# Patient Record
Sex: Male | Born: 1995 | Race: Black or African American | Hispanic: No | Marital: Single | Smoking: Never smoker
Health system: Southern US, Community
[De-identification: ages and names within clinical notes are randomized; demographics above are authoritative.]

## PROBLEM LIST (undated history)

## (undated) DIAGNOSIS — F988 Other specified behavioral and emotional disorders with onset usually occurring in childhood and adolescence: Secondary | ICD-10-CM

## (undated) DIAGNOSIS — L700 Acne vulgaris: Secondary | ICD-10-CM

## (undated) DIAGNOSIS — M4120 Other idiopathic scoliosis, site unspecified: Secondary | ICD-10-CM

## (undated) HISTORY — DX: Acne vulgaris: L70.0

## (undated) HISTORY — DX: Other idiopathic scoliosis, site unspecified: M41.20

## (undated) HISTORY — DX: Other specified behavioral and emotional disorders with onset usually occurring in childhood and adolescence: F98.8

---
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
## 2013-04-01 ENCOUNTER — Encounter (HOSPITAL_COMMUNITY): Payer: Self-pay | Admitting: Emergency Medicine

## 2013-04-01 ENCOUNTER — Emergency Department (HOSPITAL_COMMUNITY): Payer: Self-pay

## 2013-04-01 ENCOUNTER — Emergency Department (HOSPITAL_COMMUNITY)
Admission: EM | Admit: 2013-04-01 | Discharge: 2013-04-01 | Disposition: A | Discharge status: Home / Self Care | Payer: Self-pay | Attending: Emergency Medicine | Admitting: Emergency Medicine

## 2013-04-01 DIAGNOSIS — IMO0002 Reserved for concepts with insufficient information to code with codable children: Secondary | ICD-10-CM

## 2013-04-01 DIAGNOSIS — Z8739 Personal history of other diseases of the musculoskeletal system and connective tissue: Secondary | ICD-10-CM | POA: Insufficient documentation

## 2013-04-01 DIAGNOSIS — L03119 Cellulitis of unspecified part of limb: Principal | ICD-10-CM

## 2013-04-01 DIAGNOSIS — F988 Other specified behavioral and emotional disorders with onset usually occurring in childhood and adolescence: Secondary | ICD-10-CM | POA: Insufficient documentation

## 2013-04-01 DIAGNOSIS — Z79899 Other long term (current) drug therapy: Secondary | ICD-10-CM | POA: Insufficient documentation

## 2013-04-01 DIAGNOSIS — L02519 Cutaneous abscess of unspecified hand: Secondary | ICD-10-CM | POA: Insufficient documentation

## 2013-04-01 MED ORDER — CEPHALEXIN 500 MG PO CAPS
500.0000 mg | ORAL_CAPSULE | Freq: Four times a day (QID) | ORAL | Status: DC
Start: 2013-04-01 — End: 2013-09-18

## 2013-04-01 NOTE — Discharge Instructions (Signed)
Cellulitis Cellulitis is an infection of the skin and the tissue beneath it. The infected area is usually red and tender. Cellulitis occurs most often in the arms and lower legs.  CAUSES  Cellulitis is caused by bacteria that enter the skin through cracks or cuts in the skin. The most common types of bacteria that cause cellulitis are Staphylococcus and Streptococcus. SYMPTOMS   Redness and warmth.  Swelling.  Tenderness or pain.  Fever. DIAGNOSIS  Your caregiver can usually determine what is wrong based on a physical exam. Blood tests may also be done. TREATMENT  Treatment usually involves taking an antibiotic medicine. HOME CARE INSTRUCTIONS   Take your antibiotics as directed. Finish them even if you start to feel better.  Keep the infected arm or leg elevated to reduce swelling.  Apply a warm cloth to the affected area up to 4 times per day to relieve pain.  Only take over-the-counter or prescription medicines for pain, discomfort, or fever as directed by your caregiver.  Keep all follow-up appointments as directed by your caregiver. SEEK MEDICAL CARE IF:   You notice red streaks coming from the infected area.  Your red area gets larger or turns dark in color.  Your bone or joint underneath the infected area becomes painful after the skin has healed.  Your infection returns in the same area or another area.  You notice a swollen bump in the infected area.  You develop new symptoms. SEEK IMMEDIATE MEDICAL CARE IF:   You have a fever.  You feel very sleepy.  You develop vomiting or diarrhea.  You have a general ill feeling (malaise) with muscle aches and pains. MAKE SURE YOU:   Understand these instructions.  Will watch your condition.  Will get help right away if you are not doing well or get worse. Document Released: 12/24/2004 Document Revised: 09/15/2011 Document Reviewed: 06/01/2011 ExitCare Patient Information 2014 [employer].  

## 2013-04-01 NOTE — ED Notes (Signed)
Per pt sts woke up and left hand was swollen. Denies injury

## 2013-04-01 NOTE — ED Notes (Signed)
Patient transported to X-ray 

## 2013-04-01 NOTE — ED Notes (Signed)
Patient returned from X-ray 

## 2013-04-01 NOTE — ED Provider Notes (Signed)
CSN: [id number]     Arrival date & time 04/01/13  1440 History  This chart was scribed for non-physician practitioner, Felicie Morn, NP-C working with Celene Kras, MD by Greggory Stallion, ED scribe. This patient was seen in room TR09C/TR09C and the patient's care was started at 3:57 PM.   Chief Complaint  Patient presents with  . Hand Problem   Patient is a 18 y.o. male presenting with hand pain. The history is provided by the patient. No language interpreter was used.  Hand Pain This is a new problem. The current episode started 12 to 24 hours ago. The problem has been gradually worsening. Pertinent negatives include no headaches. The symptoms are aggravated by bending. Nothing relieves the symptoms. He has tried water for the symptoms. The treatment provided no relief.   HPI Comments: Eric Arnold is a 18 y.o. male who presents to the Emergency Department complaining of left hand swelling that started this morning when he woke up. He reports no pain when he woke this morning but now it hurts on palpation and when he extends his fingers. He denies injury. He denies fevers, chills. He is a Consulting civil engineer at [employer] and does some typing on his laptop but denies repetitive motions. He states he lifted his couch yesterday but denies feeling anything unusual. He has tried rinsing the hand under cold and warm water but has not tried icing the area. He has no allergies to medications.   Past Medical History  Diagnosis Date  . ADD (attention deficit disorder)   . Scoliosis (and kyphoscoliosis), idiopathic   . Acne vulgaris    History reviewed. No pertinent past surgical history. History reviewed. No pertinent family history. History  Substance Use Topics  . Smoking status: Never Smoker   . Smokeless tobacco: Not on file  . Alcohol Use: No    Review of Systems  Constitutional: Negative for fever and chills.  Musculoskeletal: Positive for arthralgias (left hand) and joint swelling.  Neurological:  Negative for headaches.  All other systems reviewed and are negative.    Allergies  Review of patient's allergies indicates no known allergies.  Home Medications   Current Outpatient Rx  Name  Route  Sig  Dispense  Refill  . adapalene (DIFFERIN) 0.1 % cream   Topical   Apply topically at bedtime.   45 g   0   . methylphenidate (CONCERTA) 54 MG CR tablet   Oral   Take 1 tablet (54 mg total) by mouth every morning.   30 tablet   0    BP 130/74  Pulse 82  Temp(Src) 98.6 F (37 C) (Oral)  Resp 16  Wt 212 lb 14.4 oz (96.571 kg)  SpO2 98%  Physical Exam  Nursing note and vitals reviewed. Constitutional: He is oriented to person, place, and time. He appears well-developed and well-nourished. No distress.  HENT:  Head: Normocephalic and atraumatic.  Eyes: EOM are normal.  Neck: Neck supple. No tracheal deviation present.  Cardiovascular: Normal rate.   Pulmonary/Chest: Effort normal. No respiratory distress.  Musculoskeletal: Normal range of motion.  swelling over the MCP joint of the third finger of the left hand, questionable insect bite.   Neurological: He is alert and oriented to person, place, and time.  Skin: Skin is warm and dry.  Psychiatric: He has a normal mood and affect. His behavior is normal.    ED Course  Procedures (including critical care time) Medications - No data to display  DIAGNOSTIC STUDIES: Oxygen  Saturation is 98% on RA, normal by my interpretation.    COORDINATION OF CARE: 4:02 PM-Discussed treatment plan with pt at bedside which includes Keflex and pt agreed to plan.   Labs Review Labs Reviewed - No data to display Imaging Review Dg Hand Complete Left  04/01/2013   CLINICAL DATA:  Posterior left hand pain and swelling for 1 day with no injury  EXAM: LEFT HAND - COMPLETE 3+ VIEW  COMPARISON:  None.  FINDINGS: No fracture, dislocation, foreign body, or periosteal reaction. There is dorsal soft tissue swelling over the distal metacarpals.   IMPRESSION: Nonspecific soft tissue swelling.  Otherwise negative.   Electronically Signed   By: Esperanza Heiraymond  Rubner M.D.   On: 04/01/2013 15:52    EKG Interpretation   None       MDM  Cellulitis left hand.  No indication of tenosynovitis.  Antibiotic, PCP follow-up.  I personally performed the services described in this documentation, which was scribed in my presence. The recorded information has been reviewed and is accurate.   Jimmye Normanavid John Mamoudou [place], NP 04/02/13 [phone]

## 2013-04-02 NOTE — ED Provider Notes (Signed)
Medical screening examination/treatment/procedure(s) were performed by non-physician practitioner and as supervising physician I was immediately available for consultation/collaboration.    Cabe Lashley R Kushal Saunders, MD 04/02/13 1547 

## 2013-04-19 ENCOUNTER — Ambulatory Visit: Payer: No Typology Code available for payment source

## 2013-08-22 ENCOUNTER — Ambulatory Visit: Payer: No Typology Code available for payment source | Admitting: Family Medicine

## 2013-09-18 ENCOUNTER — Encounter: Payer: Self-pay | Admitting: Family Medicine

## 2013-09-18 ENCOUNTER — Ambulatory Visit (INDEPENDENT_AMBULATORY_CARE_PROVIDER_SITE_OTHER): Payer: No Typology Code available for payment source | Admitting: Family Medicine

## 2013-09-18 VITALS — BP 118/59 | HR 64 | Temp 98.2°F | Ht 61.0 in | Wt 218.1 lb

## 2013-09-18 DIAGNOSIS — F909 Attention-deficit hyperactivity disorder, unspecified type: Secondary | ICD-10-CM

## 2013-09-18 MED ORDER — METHYLPHENIDATE HCL 10 MG PO TABS
10.0000 mg | ORAL_TABLET | Freq: Two times a day (BID) | ORAL | Status: AC
Start: 2013-09-18 — End: ?

## 2013-09-18 NOTE — Patient Instructions (Signed)
Please try to pick up the Ritalin.  If this is too expensive, please let me know so we can try to get this for you.  Thanks, Dr. Paulina FusiHess

## 2013-09-18 NOTE — Assessment & Plan Note (Signed)
Unable to afford Concerta.  Rx coupon printed off for ritalin 10 mg, 90 tab supply for possibly $15.  Recommend they pick this up, but he is not currently in school due to summer, and will have him start in late July (1 tab in AM, and one in early PM) to see how this does for him.  Will f/u in 6 weeks prior to school starting.

## 2013-09-18 NOTE — Progress Notes (Signed)
Eric Arnold is a 18 y.o. male who presents today for ADHD  ADHD: Patient carries a diagnosis of ADHD and has been on concerta 36 mg qd in the past.  However, pt lost medicaid and was unable to afford this for the last 6 months.  Per mom and himself, he did well in school but his concentration was still not very good.  They got a dog, which seemed to help him in social situations.  Would like to try something cheaper than Concerta.    Past Medical History  Diagnosis Date  . ADD (attention deficit disorder)   . Scoliosis (and kyphoscoliosis), idiopathic   . Acne vulgaris     History  Smoking status  . Never Smoker   Smokeless tobacco  . Not on file    No family history on file.  Current Outpatient Prescriptions on File Prior to Visit  Medication Sig Dispense Refill  . adapalene (DIFFERIN) 0.1 % cream Apply topically at bedtime.  45 g  0  . cephALEXin (KEFLEX) 500 MG capsule Take 1 capsule (500 mg total) by mouth 4 (four) times daily.  20 capsule  0  . methylphenidate (CONCERTA) 54 MG CR tablet Take 1 tablet (54 mg total) by mouth every morning.  30 tablet  0   No current facility-administered medications on file prior to visit.    ROS: Per HPI.  All other systems reviewed and are negative.   Physical Exam Filed Vitals:   09/18/13 1008  BP: 118/59  Pulse: 64  Temp: 98.2 F (36.8 C)    Gen: NAD. Normal Affect and mood  CV: RRR  Resp: CTABL

## 2013-09-21 ENCOUNTER — Telehealth: Payer: Self-pay | Admitting: Family Medicine

## 2013-09-21 NOTE — Telephone Encounter (Signed)
Mom called. She needed to tell dr about things Eric Arnold is forgetting. Wanted him to know this. hasnt gotten meds for ADHD.  Just wondered if these were symptons of ADH.d

## 2013-09-21 NOTE — Telephone Encounter (Signed)
Patient's mother was concerned about ADHD causing memory loss.I informed her that Adhd dx is mainly for focus issues.I advised her to schedule a follow with [facility] after having son take new RX ritalin for 3 weeks and at that visit to have Dr Paulina FusiHess go in detail about Adhd and memory loss.she voiced great appreciation.Proposito, Virgel BouquetGiovanna S

## 2013-10-17 ENCOUNTER — Ambulatory Visit: Payer: No Typology Code available for payment source

## 2014-05-01 ENCOUNTER — Ambulatory Visit: Payer: Self-pay

## 2014-05-07 ENCOUNTER — Ambulatory Visit: Payer: Self-pay

## 2016-12-09 ENCOUNTER — Encounter (HOSPITAL_COMMUNITY): Payer: Self-pay | Admitting: *Deleted

## 2016-12-09 ENCOUNTER — Ambulatory Visit (HOSPITAL_COMMUNITY)
Admission: EM | Admit: 2016-12-09 | Discharge: 2016-12-09 | Disposition: A | Discharge status: Home / Self Care | Payer: BLUE CROSS/BLUE SHIELD | Attending: Family Medicine | Admitting: Family Medicine

## 2016-12-09 DIAGNOSIS — K529 Noninfective gastroenteritis and colitis, unspecified: Secondary | ICD-10-CM

## 2016-12-09 MED ORDER — ONDANSETRON 4 MG PO TBDP
4.0000 mg | ORAL_TABLET | Freq: Three times a day (TID) | ORAL | 0 refills | Status: AC | PRN
Start: 2016-12-09 — End: ?

## 2016-12-09 NOTE — ED Triage Notes (Signed)
Pt  Reports  About  4  Days  Ago he   Developed  abd  Pain   With     Diarrhea    And  Discomfort  In rectal   Area     - pt  Reports  Pain   Is   Worse   When he  Coughs  Or  Laughs       He  Is  Sitting  Upright on the  Exam table  Speaking in  Complete    sentances

## 2016-12-10 NOTE — ED Provider Notes (Signed)
  [facility]   [id number] 12/09/16 Arrival Time: 1404  ASSESSMENT & PLAN:  1. Gastroenteritis     Meds ordered this encounter  Medications  . ondansetron (ZOFRAN-ODT) 4 MG disintegrating tablet    Sig: Take 1 tablet (4 mg total) by mouth every 8 (eight) hours as needed for nausea or vomiting.    Dispense:  12 tablet    Refill:  0   Ensure adequate fluid intake. Symptoms should resolve within the next day or two. May f/u as needed. Reviewed expectations re: course of current medical issues. Questions answered. Outlined signs and symptoms indicating need for more acute intervention. Patient verbalized understanding. After Visit Summary given.   SUBJECTIVE:  Eric Arnold is a 21 y.o. male who presents with complaint of diarrhea for 3-4 days. Feels like it's getting better but still with crampy feeling in abdomen. No back or flank pain. Mild nausea without emesis. Decreased appetite. Afebrile. No recent travel. No OTC treatment.  ROS: As per HPI.   OBJECTIVE:  Vitals:   12/09/16 1432  BP: 122/70  Pulse: 72  Resp: 18  Temp: 98.6 F (37 C)  TempSrc: Oral  SpO2: 100%    General appearance: alert; no distress HENT: moist oropharynx Heart: regular rate and rhythm Abdomen: soft, non-tender; bowel sounds normal; no masses or organomegaly; no guarding or rebound tenderness Back: no CVA tenderness Extremities: no cyanosis or edema; symmetrical with no gross deformities Skin: warm and dry Psychological: alert and cooperative; normal mood and affect  No Known Allergies  Past Medical History:  Diagnosis Date  . Acne vulgaris   . ADD (attention deficit disorder)   . Scoliosis (and kyphoscoliosis), idiopathic    Social History   Social History  . Marital status: Single    Spouse name: N/A  . Number of children: N/A  . Years of education: N/A   Occupational History  . Not on file.   Social History Main Topics  . Smoking status: Never Smoker  .  Smokeless tobacco: Not on file  . Alcohol use No  . Drug use: No  . Sexual activity: Not on file   Other Topics Concern  . Not on file   Social History Narrative  . No narrative on file      Mardella LaymanHagler, Mehran Guderian, MD 12/10/16 1001

## 2017-07-16 ENCOUNTER — Emergency Department (HOSPITAL_COMMUNITY)
Admission: EM | Admit: 2017-07-16 | Discharge: 2017-07-16 | Disposition: A | Discharge status: Home / Self Care | Payer: BLUE CROSS/BLUE SHIELD | Attending: Emergency Medicine | Admitting: Emergency Medicine

## 2017-07-16 ENCOUNTER — Other Ambulatory Visit: Payer: Self-pay

## 2017-07-16 ENCOUNTER — Emergency Department (HOSPITAL_COMMUNITY): Payer: BLUE CROSS/BLUE SHIELD

## 2017-07-16 ENCOUNTER — Encounter (HOSPITAL_COMMUNITY): Payer: Self-pay | Admitting: Emergency Medicine

## 2017-07-16 DIAGNOSIS — Y939 Activity, unspecified: Secondary | ICD-10-CM | POA: Insufficient documentation

## 2017-07-16 DIAGNOSIS — Y990 Civilian activity done for income or pay: Secondary | ICD-10-CM | POA: Insufficient documentation

## 2017-07-16 DIAGNOSIS — S0990XD Unspecified injury of head, subsequent encounter: Secondary | ICD-10-CM

## 2017-07-16 DIAGNOSIS — Y929 Unspecified place or not applicable: Secondary | ICD-10-CM | POA: Insufficient documentation

## 2017-07-16 DIAGNOSIS — Z79899 Other long term (current) drug therapy: Secondary | ICD-10-CM | POA: Insufficient documentation

## 2017-07-16 DIAGNOSIS — S0990XA Unspecified injury of head, initial encounter: Secondary | ICD-10-CM | POA: Diagnosis present

## 2017-07-16 DIAGNOSIS — W208XXA Other cause of strike by thrown, projected or falling object, initial encounter: Secondary | ICD-10-CM | POA: Insufficient documentation
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
## 2017-07-16 NOTE — ED Notes (Signed)
Signature pad not working, pt and mother verbalized understanding of discharge instructions.

## 2017-07-16 NOTE — ED Notes (Signed)
Patient transported to CT 

## 2017-07-16 NOTE — ED Provider Notes (Signed)
[facility] EMERGENCY DEPARTMENT Provider Note   CSN: [id number] Arrival date & time: 07/16/17  1327     History   Chief Complaint Chief Complaint  Patient presents with  . Head Injury    HPI Eric Arnold is a 22 y.o. male with a history of ADHD who presents emergency department today for head injury.  Patient states that yesterday at work around 7:30am a box from a elevated height fell and hit him across the front forehead.  He denies any loss of consciousness.  No alcohol or drug use prior to the event.  No nausea or vomiting since the event.  Patient was seen at employee health and had 3 sutures placed across area where the laceration occurred.  He now complains of pain over the area.  He denies any headache, visual changes, numbness/tingling of the face, numbness/tingling/weakness of the upper or lower extremities, vertigo, amnesia, confusion.  Mother is at bedside and states that she feels his words are more slurred and is requesting a CT scan.  Patient is not on any blood thinners.  No neck pain.  Tetanus updated yesterday.   HPI  Past Medical History:  Diagnosis Date  . Acne vulgaris   . ADD (attention deficit disorder)   . Scoliosis (and kyphoscoliosis), idiopathic     Patient Active Problem List   Diagnosis Date Noted  . FOLLICULITIS, CHRONIC 04/14/2010  . 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 OBESITY 08/11/2006  . ADHD 07/02/2006    History reviewed. No pertinent surgical history.      Home Medications    Prior to Admission medications   Medication Sig Start Date End Date Taking? Authorizing Provider  adapalene (DIFFERIN) 0.1 % cream Apply topically at bedtime. 07/26/12   Brent Bulla, MD  methylphenidate (RITALIN) 10 MG tablet Take 1 tablet (10 mg total) by mouth 2 (two) times daily. 09/18/13   Hess, Twana First, DO  ondansetron (ZOFRAN-ODT) 4 MG disintegrating tablet Take 1 tablet (4 mg total) by  mouth every 8 (eight) hours as needed for nausea or vomiting. 12/09/16   Mardella Layman, MD    Family History No family history on file.  Social History Social History   Tobacco Use  . Smoking status: Never Smoker  . Smokeless tobacco: Never Used  Substance Use Topics  . Alcohol use: No  . Drug use: No     Allergies   Patient has no known allergies.   Review of Systems Review of Systems  All other systems reviewed and are negative.    Physical Exam Updated Vital Signs BP 118/69 (BP Location: Right Arm)   Pulse 69   Temp 98.7 F (37.1 C) (Oral)   Resp 15   Ht 5\' 6"  (1.676 m)   Wt 127 kg (280 lb)   SpO2 99%   BMI 45.19 kg/m   Physical Exam  Constitutional: He appears well-developed and well-nourished.  HENT:  Head: Normocephalic and atraumatic.  Right Ear: Tympanic membrane and external ear normal.  Left Ear: Tympanic membrane and external ear normal.  Nose: Nose normal.  Patient with 1.5 cm laceration to the frontal right scalp with 3 blue sutures in place.  No warmth, erythema, induration or drainage overlying or surrounding.  No underlying hematoma.  No palpable open or depressed skull fractures.  No hemotympanum.  No CSF otorrhea.  No raccoon eyes or battle signs.  Eyes: Conjunctivae are normal. Right eye exhibits no discharge. Left eye  exhibits no discharge. No scleral icterus.  Neck:  No C-spine tenderness palpation or step-offs.  Pulmonary/Chest: Effort normal. No respiratory distress.  Neurological: He is alert.  Mental Status:  Alert, oriented, thought content appropriate, able to give a coherent history. Speech fluent without evidence of aphasia. Able to follow 2 step commands without difficulty.  Cranial Nerves:  II:  Peripheral visual fields grossly normal, pupils equal, round, reactive to light III,IV, VI: ptosis not present, extra-ocular motions intact bilaterally  V,VII: smile symmetric, eyebrows raise symmetric, facial light touch sensation  equal VIII: hearing grossly normal to voice  X: uvula elevates symmetrically  XI: bilateral shoulder shrug symmetric and strong XII: midline tongue extension without fassiculations Motor:  Normal tone. 5/5 in upper and lower extremities bilaterally including strong and equal grip strength and dorsiflexion/plantar flexion Sensory: Sensation intact to light touch in all extremities. Negative Romberg.  Deep Tendon Reflexes: 2+ and symmetric in the biceps and patella Cerebellar: normal finger-to-nose with bilateral upper extremities. Normal heel-to -shin balance bilaterally of the lower extremity. No pronator drift.  Gait: normal gait and balance CV: distal pulses palpable throughout   Skin: No pallor.  Psychiatric: He has a normal mood and affect.  Nursing note and vitals reviewed.    ED Treatments / Results  Labs (all labs ordered are listed, but only abnormal results are displayed) Labs Reviewed - No data to display  EKG None  Radiology Ct Head Wo Contrast  Result Date: 07/16/2017 CLINICAL DATA:  Pt was hit on the head by a heavy box and fell yesterday. Denies LOC. Laceration to right side forehead, stitched yesterday. EXAM: CT HEAD WITHOUT CONTRAST TECHNIQUE: Contiguous axial images were obtained from the base of the skull through the vertex without intravenous contrast. COMPARISON:  None. FINDINGS: Brain: No acute intracranial hemorrhage. No focal mass lesion. No CT evidence of acute infarction. No midline shift or mass effect. No hydrocephalus. Basilar cisterns are patent. Vascular: No hyperdense vessel or unexpected calcification. Skull: Normal. Negative for fracture or focal lesion. Sinuses/Orbits: Paranasal sinuses and mastoid air cells are clear. Orbits are clear. Other: None. IMPRESSION: Normal head CT.  No intracranial trauma. Electronically Signed   By: Genevive Bi M.D.   On: 07/16/2017 21:41    Procedures Procedures (including critical care time)  Medications Ordered  in ED Medications - No data to display   Initial Impression / Assessment and Plan / ED Course  I have reviewed the triage vital signs and the nursing notes.  Pertinent labs & imaging results that were available during my care of the patient were reviewed by me and considered in my medical decision making (see chart for details).     22 year old male presenting for head injury that occurred yesterday at 7:30 AM.  No reported loss of consciousness.  No alcohol or drug use prior to the event.  No nausea or vomiting since event.  No headaches neurologic symptoms reported since the event.  He did have a small laceration of the right forehead that was repaired at his employees health clinic.  His tetanus was updated.  Patient without basilar skull fracture on exam.  Neurologic exam normal.  No focal neurologic deficits.  Advised that I do not feel the patient needs a CT scan.  Mother states that she will not leave the department until they have a CT scan.  I discussed the risks versus benefits of this.  She wishes to proceed.  Ct scan negative. Patient remains asymptomatic. The evaluation does not  show pathology that would require ongoing emergent intervention or inpatient treatment. I advised the patient to follow-up with PCP this week. I advised the patient to return to the emergency department with new or worsening symptoms or new concerns. Specific return precautions discussed. The patient verbalized understanding and agreement with plan. All questions answered. No further questions at this time. The patient is hemodynamically stable, mentating appropriately and appears safe for discharge.  Final Clinical Impressions(s) / ED Diagnoses   Final diagnoses:  Injury of head, subsequent encounter    ED Discharge Orders    None       Princella PellegriniMaczis, Rommie Dunn M, PA-C 07/16/17 2326    Doug SouJacubowitz, Sam, MD 07/17/17 [phone]

## 2017-07-16 NOTE — Discharge Instructions (Signed)
Your CT scan did not show any skull fracture or concerning intracranial injury. Please follow up with your PCP this week. If you develop worsening or new concerning symptoms you can return to the emergency department for re-evaluation.

## 2017-12-10 ENCOUNTER — Encounter

## 2018-09-22 IMAGING — CT CT HEAD W/O CM
4 series · 17 of 47 positions shown, 19 images · non-contrast
Comparison: None.

CLINICAL DATA: Pt was hit on the head by a heavy box and fell
yesterday. Denies LOC. Laceration to right side forehead, stitched
yesterday.

EXAM:
CT HEAD WITHOUT CONTRAST
TECHNIQUE: Contiguous axial images were obtained from the base of the skull
through the vertex without intravenous contrast.

[Series 3: head wo · axial · 0.47mm/px · z∈[-126,+10]mm · 7 of 37 slices shown, 9 images]
[im 5/37  brain]
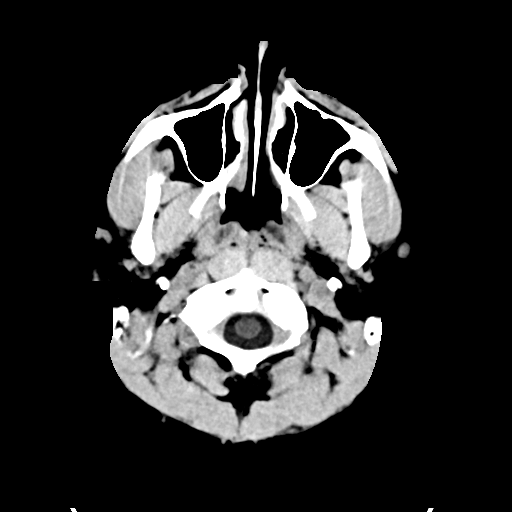
[im 5/37  bone]
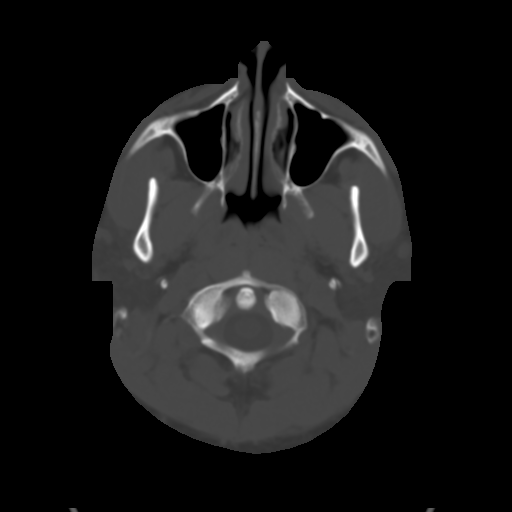
[im 10/37  brain]
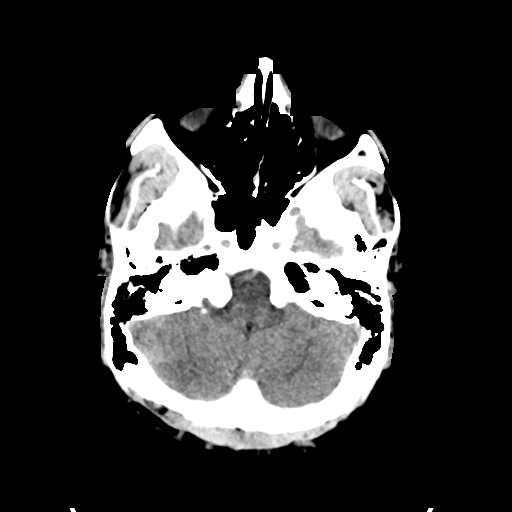
[im 14/37  brain]
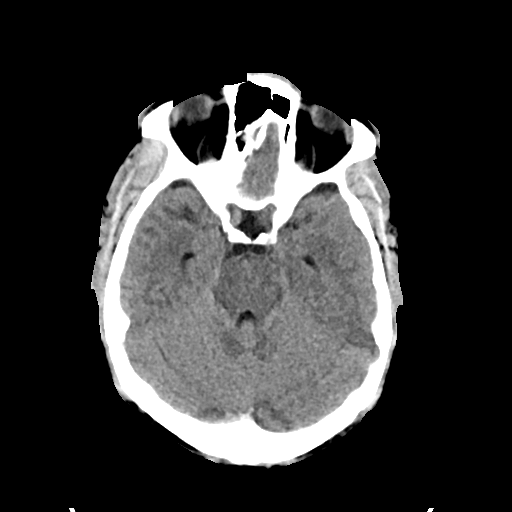
[im 19/37  brain]
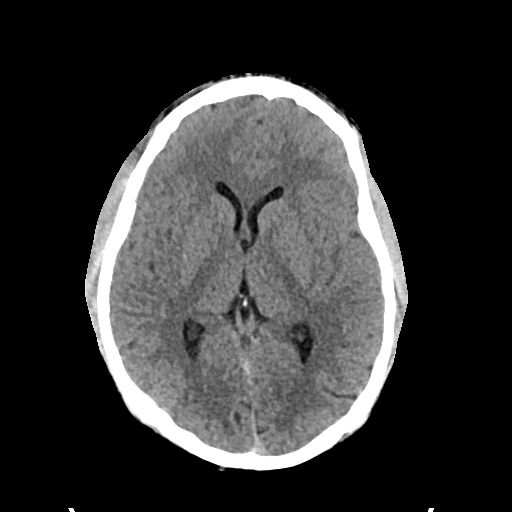
[im 23/37  brain]
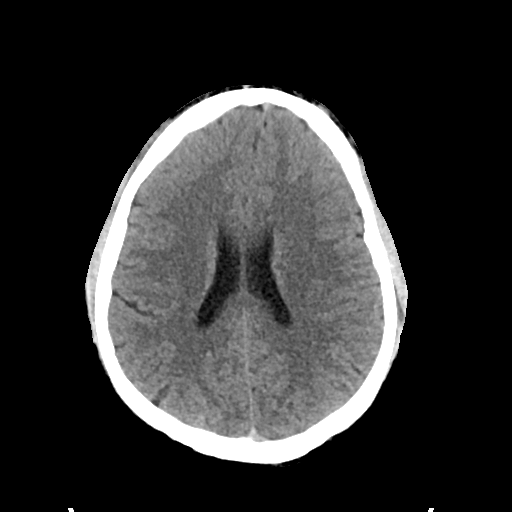
[im 23/37  bone]
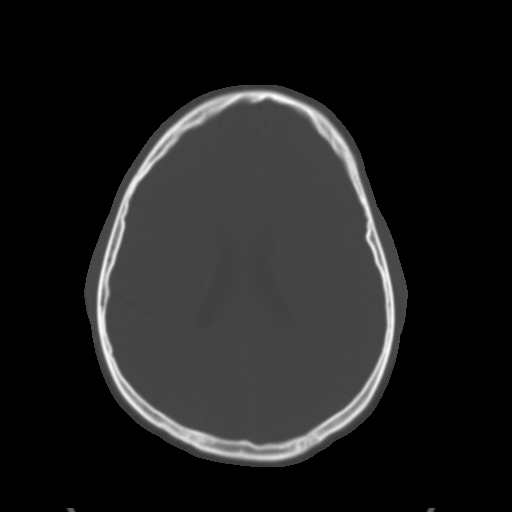
[im 28/37  brain]
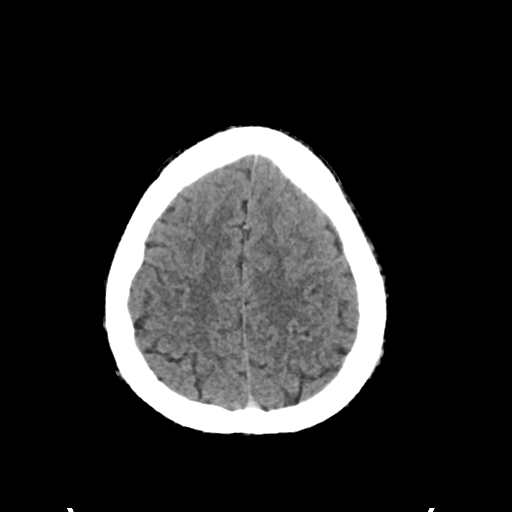
[im 32/37  brain]
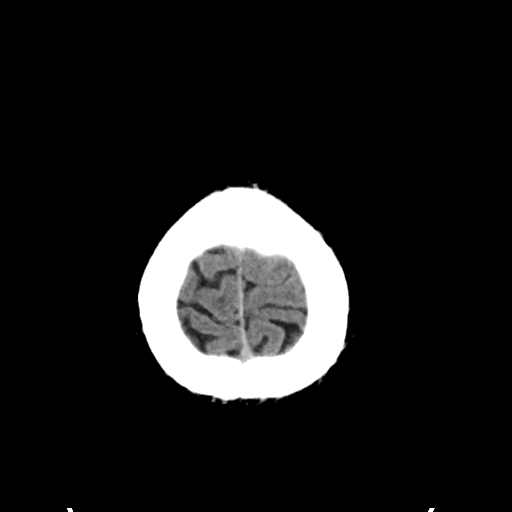

[Series 4: head bone · axial · 0.47mm/px · z∈[-128,-66]mm · 4 of 91 slices shown]
[im 10/91  bone]
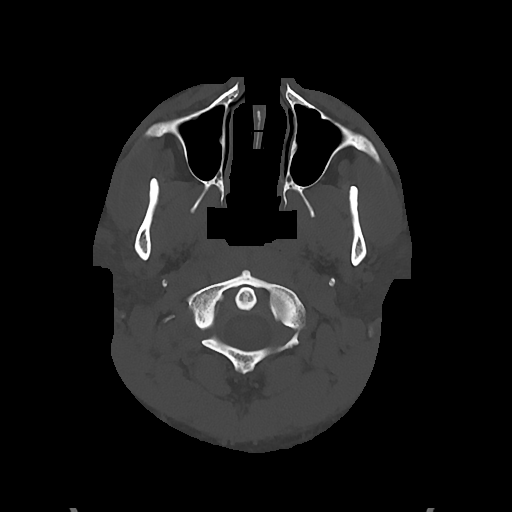
[im 19/91  bone]
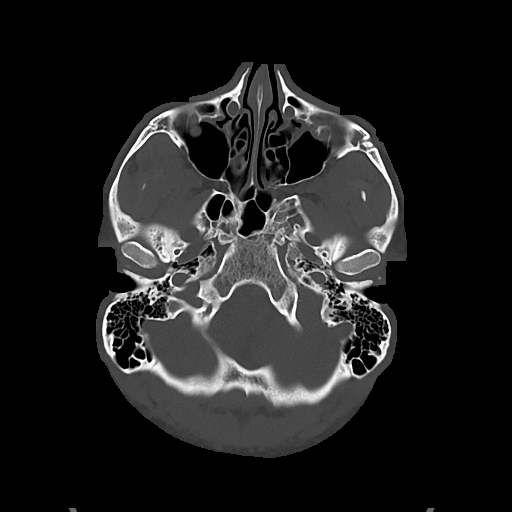
[im 28/91  bone]
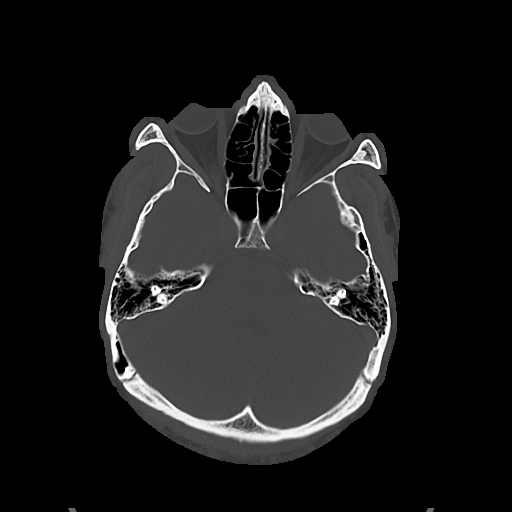
[im 41/91  bone]
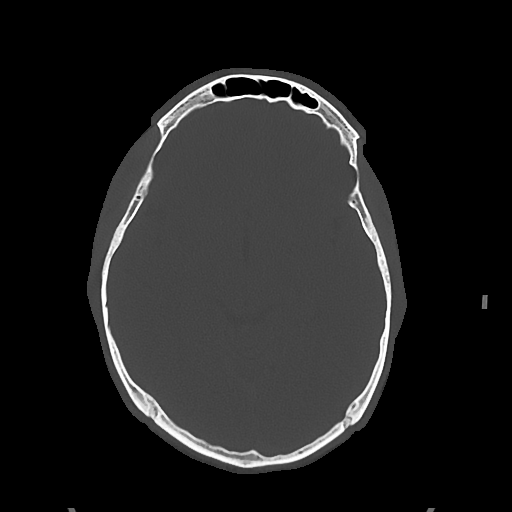

[Series 5: cor soft · coronal · 0.35mm/px · 3 of 74 slices shown]
[im 25/74  brain]
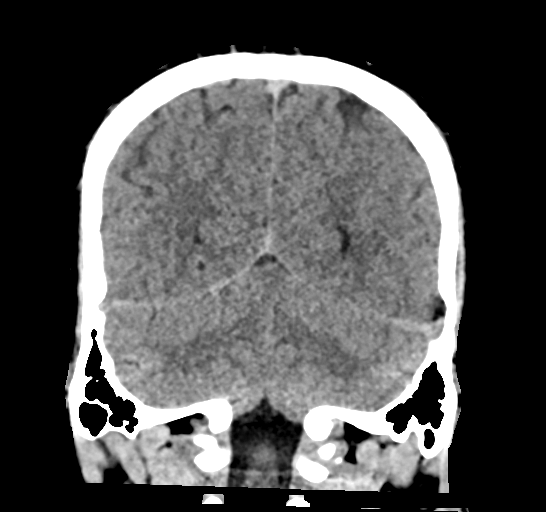
[im 33/74  brain]
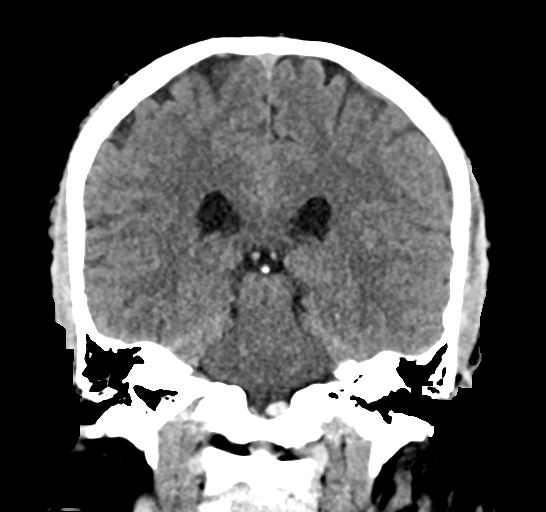
[im 41/74  brain]
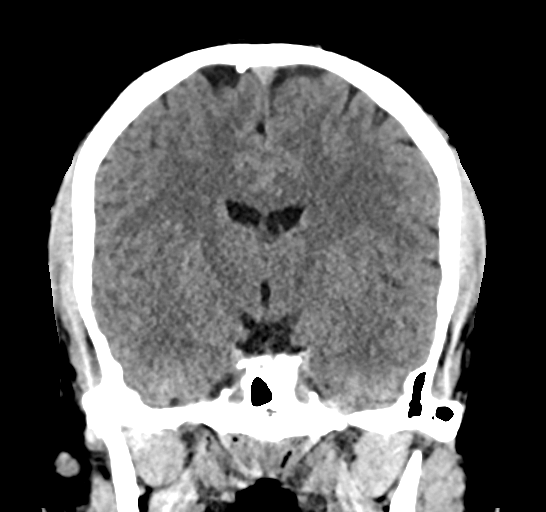

[Series 6: sag soft · sagittal · 0.35mm/px · 3 of 64 slices shown]
[im 22/64  brain]
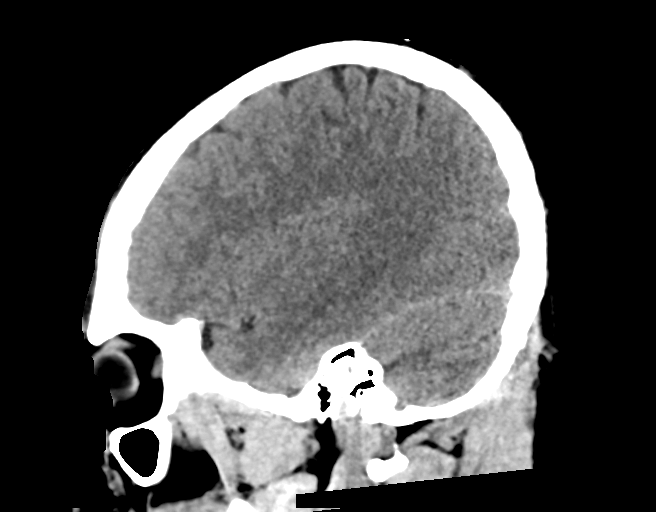
[im 32/64  brain]
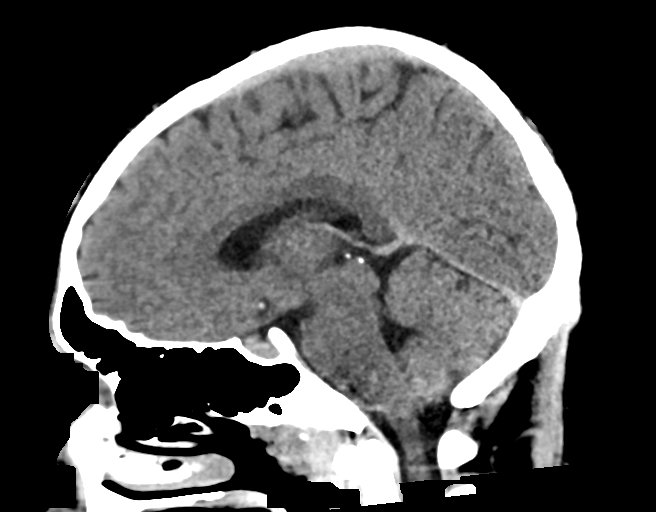
[im 43/64  brain]
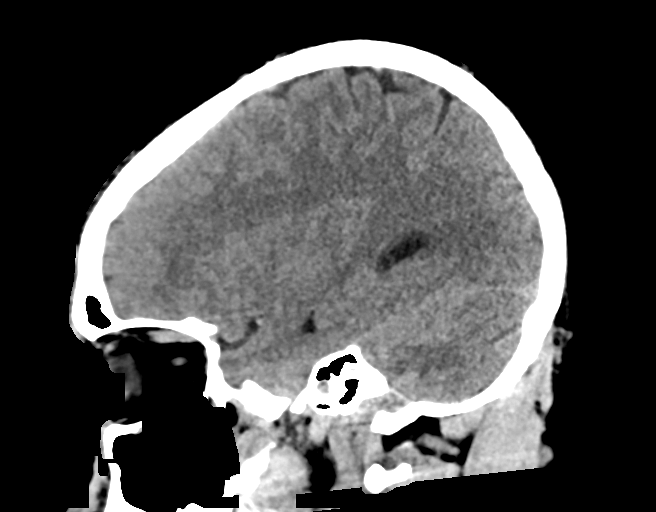

[17 of 47 positions shown; findings below may reference images not displayed]

FINDINGS: Brain: No acute intracranial hemorrhage. No focal mass lesion. No CT
evidence of acute infarction. No midline shift or mass effect. No
hydrocephalus. Basilar cisterns are patent.

Vascular: No hyperdense vessel or unexpected calcification.

Skull: Normal. Negative for fracture or focal lesion.

Sinuses/Orbits: Paranasal sinuses and mastoid air cells are clear.
Orbits are clear.

Other: None.
IMPRESSION: Normal head CT.  No intracranial trauma.

## 2021-04-01 ENCOUNTER — Other Ambulatory Visit: Payer: Self-pay

## 2021-04-01 ENCOUNTER — Emergency Department (HOSPITAL_COMMUNITY)
Admission: EM | Admit: 2021-04-01 | Discharge: 2021-04-01 | Disposition: A | Discharge status: Home / Self Care | Payer: BC Managed Care – PPO | Attending: Emergency Medicine | Admitting: Emergency Medicine

## 2021-04-01 ENCOUNTER — Ambulatory Visit (HOSPITAL_COMMUNITY)
Admission: EM | Admit: 2021-04-01 | Discharge: 2021-04-01 | Disposition: A | Discharge status: Other Acute Inpatient Hospital | Payer: BLUE CROSS/BLUE SHIELD

## 2021-04-01 ENCOUNTER — Encounter (HOSPITAL_COMMUNITY): Payer: Self-pay

## 2021-04-01 DIAGNOSIS — J069 Acute upper respiratory infection, unspecified: Secondary | ICD-10-CM | POA: Insufficient documentation

## 2021-04-01 DIAGNOSIS — Z20822 Contact with and (suspected) exposure to covid-19: Secondary | ICD-10-CM | POA: Insufficient documentation

## 2021-04-01 DIAGNOSIS — R55 Syncope and collapse: Secondary | ICD-10-CM | POA: Insufficient documentation

## 2021-04-01 DIAGNOSIS — R519 Headache, unspecified: Secondary | ICD-10-CM | POA: Diagnosis present

## 2021-04-01 LAB — RESP PANEL BY RT-PCR (FLU A&B, COVID) ARPGX2
Influenza A by PCR: NEGATIVE
Influenza B by PCR: NEGATIVE
SARS Coronavirus 2 by RT PCR: NEGATIVE

## 2021-04-01 LAB — CBC WITH DIFFERENTIAL/PLATELET
Abs Immature Granulocytes: 0.03 10*3/uL (ref 0.00–0.07)
Basophils Absolute: 0 10*3/uL (ref 0.0–0.1)
Basophils Relative: 0 %
Eosinophils Absolute: 0 10*3/uL (ref 0.0–0.5)
Eosinophils Relative: 0 %
HCT: 45.3 % (ref 39.0–52.0)
Hemoglobin: 15 g/dL (ref 13.0–17.0)
Immature Granulocytes: 0 %
Lymphocytes Relative: 15 %
Lymphs Abs: 1.5 10*3/uL (ref 0.7–4.0)
MCH: 25.6 pg — ABNORMAL LOW (ref 26.0–34.0)
MCHC: 33.1 g/dL (ref 30.0–36.0)
MCV: 77.3 fL — ABNORMAL LOW (ref 80.0–100.0)
Monocytes Absolute: 1.4 10*3/uL — ABNORMAL HIGH (ref 0.1–1.0)
Monocytes Relative: 15 %
Neutro Abs: 7 10*3/uL (ref 1.7–7.7)
Neutrophils Relative %: 70 %
Platelets: 262 10*3/uL (ref 150–400)
RBC: 5.86 MIL/uL — ABNORMAL HIGH (ref 4.22–5.81)
RDW: 12.2 % (ref 11.5–15.5)
WBC: 9.9 10*3/uL (ref 4.0–10.5)
nRBC: 0 % (ref 0.0–0.2)

## 2021-04-01 LAB — BASIC METABOLIC PANEL
Anion gap: 5 (ref 5–15)
BUN: 10 mg/dL (ref 6–20)
CO2: 28 mmol/L (ref 22–32)
Calcium: 9.5 mg/dL (ref 8.9–10.3)
Chloride: 100 mmol/L (ref 98–111)
Creatinine, Ser: 0.95 mg/dL (ref 0.61–1.24)
GFR, Estimated: >60 mL/min (ref >60)
Glucose, Bld: 93 mg/dL (ref 70–99)
Potassium: 3.7 mmol/L (ref 3.5–5.1)
Sodium: 133 mmol/L — ABNORMAL LOW (ref 135–145)

## 2021-04-01 LAB — CBG MONITORING, ED: Glucose-Capillary: 89 mg/dL (ref 70–99)

## 2021-04-01 NOTE — Discharge Instructions (Signed)
The testing today did not show any serious problems with your heart, or any infections like COVID or influenza.  You may have a virus or the common cold causing your respiratory symptoms.  Make sure you are getting plenty of rest and drink a lot of fluids.  We are going to give you a release from work, for 2 days.  Return here or see the doctor of your choice for problems.

## 2021-04-01 NOTE — ED Notes (Addendum)
Patient is going to Emergency Department via Personal Vehicle with Mother driving , due to LOC and memory changes.

## 2021-04-01 NOTE — ED Triage Notes (Signed)
Pt reports going to the bathroom this morning and while sitting on the toilet he fell asleep. Pt states when he woke up he was lying on the floor and is unsure how he got from the toilet to the floor. Pt denies bearing down while having a BM, states he fell asleep. Pt endorses feeling weak for the last few days as well as a headache. Pt reports taking OTC medications with some relief. Pt is A&Ox4.

## 2021-04-01 NOTE — ED Notes (Signed)
Pt resting in bed at this time hooked up to cardiac monitor. Denies any needs, declined warm blanket. Call bell in reach bed in lowest locked position

## 2021-04-01 NOTE — ED Notes (Signed)
Dc instructions reviewed with pt and mother. No questions or concerns at this time. Will follow up as needed. Pt declined wheelchair and ambulated without difficulty out of ED with mother

## 2021-04-01 NOTE — ED Provider Notes (Signed)
[facility]-EMERGENCY DEPT Provider Note   CSN: [id number] Arrival date & time: 04/01/21  1130     History  Chief Complaint  Patient presents with   Weakness   Headache    Eric Arnold is a 26 y.o. male.  HPI He presents for evaluation of suspected syncope that occurred this morning at his home.  He went to the bathroom, then afterwards awoke to find himself lying on the floor.  He cannot remember what happened to cause this to occur.  For several days he has been "feeling weak."  He went briefly to an urgent care, before coming here, but was quickly directed to seek care at the emergency department.  Patient reports he has been ill for several days with feeling hot, achy and coughing.  He has had COVID vaccines but not a flu vaccine.  He works for [employer] as a Designer, television/film set.    Home Medications Prior to Admission medications   Medication Sig Start Date End Date Taking? Authorizing Provider  adapalene (DIFFERIN) 0.1 % cream Apply topically at bedtime. 07/26/12   Brent Bulla, MD  methylphenidate (RITALIN) 10 MG tablet Take 1 tablet (10 mg total) by mouth 2 (two) times daily. 09/18/13   Hess, Twana First, DO  ondansetron (ZOFRAN-ODT) 4 MG disintegrating tablet Take 1 tablet (4 mg total) by mouth every 8 (eight) hours as needed for nausea or vomiting. 12/09/16   Mardella Layman, MD      Allergies    Patient has no known allergies.    Review of Systems   Review of Systems  All other systems reviewed and are negative.  Physical Exam Updated Vital Signs BP 130/84    Pulse 80    Temp 98 F (36.7 C) (Oral)    Resp 14    Ht 5\' 6"  (1.676 m)    Wt 90.7 kg    SpO2 97%    BMI 32.28 kg/m  Physical Exam Vitals and nursing note reviewed.  Constitutional:      General: He is not in acute distress.    Appearance: He is well-developed. He is not ill-appearing, toxic-appearing or diaphoretic.  HENT:     Head: Normocephalic and atraumatic.     Right Ear: External ear normal.      Left Ear: External ear normal.     Nose: No congestion or rhinorrhea.     Mouth/Throat:     Pharynx: No oropharyngeal exudate or posterior oropharyngeal erythema.  Eyes:     Conjunctiva/sclera: Conjunctivae normal.     Pupils: Pupils are equal, round, and reactive to light.  Neck:     Trachea: Phonation normal.  Cardiovascular:     Rate and Rhythm: Normal rate and regular rhythm.     Heart sounds: Normal heart sounds.  Pulmonary:     Effort: Pulmonary effort is normal. No respiratory distress.     Breath sounds: Normal breath sounds. No stridor.  Abdominal:     General: There is no distension.     Palpations: Abdomen is soft.     Tenderness: There is no abdominal tenderness.  Musculoskeletal:        General: Normal range of motion.     Cervical back: Normal range of motion and neck supple.  Skin:    General: Skin is warm and dry.  Neurological:     Mental Status: He is alert and oriented to person, place, and time.     Cranial Nerves: No cranial nerve deficit.  Sensory: No sensory deficit.     Motor: No abnormal muscle tone.     Coordination: Coordination normal.  Psychiatric:        Mood and Affect: Mood normal.        Behavior: Behavior normal.        Thought Content: Thought content normal.        Judgment: Judgment normal.    ED Results / Procedures / Treatments   Labs (all labs ordered are listed, but only abnormal results are displayed) Labs Reviewed  BASIC METABOLIC PANEL - Abnormal; Notable for the following components:      Result Value   Sodium 133 (*)    All other components within normal limits  CBC WITH DIFFERENTIAL/PLATELET - Abnormal; Notable for the following components:   RBC 5.86 (*)    MCV 77.3 (*)    MCH 25.6 (*)    Monocytes Absolute 1.4 (*)    All other components within normal limits  RESP PANEL BY RT-PCR (FLU A&B, COVID) ARPGX2  CBG MONITORING, ED    EKG None   Date: 04/01/21  Rate: 88  Rhythm: normal sinus rhythm  QRS Axis:  normal  PR and QT Intervals: normal  ST/T Wave abnormalities: early repolarization  PR and QRS Conduction Disutrbances:none  Narrative Interpretation:   Old EKG Reviewed: none available    Radiology No results found.  Procedures Procedures    Medications Ordered in ED Medications - No data to display  ED Course/ Medical Decision Making/ A&P                           Medical Decision Making   Patient Vitals for the past 24 hrs:  BP Temp Temp src Pulse Resp SpO2 Height Weight  04/01/21 2045 130/84 -- -- 80 14 97 % -- --  04/01/21 2030 128/85 -- -- 86 16 99 % -- --  04/01/21 2015 (!) 142/94 -- -- 89 19 99 % -- --  04/01/21 1929 (!) 152/90 -- -- 91 18 98 % -- --  04/01/21 1928 -- -- -- 83 -- 99 % -- --  04/01/21 1927 -- -- -- 83 -- 98 % -- --  04/01/21 1926 (!) 152/90 -- -- -- -- -- -- --  04/01/21 1230 -- -- -- -- -- -- 5\' 6"  (1.676 m) 90.7 kg  04/01/21 1229 (!) 135/92 98 F (36.7 C) Oral 92 16 96 % -- --    9:14 PM Reevaluation with update and discussion with patient. After initial assessment and treatment, an updated evaluation reveals no change in clinical status. Illness risk, decompensation and worsening symptoms of URI, discussed. Daleen Bo    Medical Decision Making: Summary: Patient presents for syncope followed by several days of feeling ill with myalgia and cough.  He has had COVID vaccines but not a flu vaccine.  No prior history of syncope.  No injury from syncopal episode.  He presents by private vehicle for evaluation  Critical Interventions-plan evaluation, laboratory testing, EKG, cardiac monitor, observation and reassessment; to evaluate  Chief Complaint  Patient presents with   Weakness   Headache    and assess for illness characterized as Acute, Previously Undiagnosed, Uncertain Prognosis, Complicated, and Systemic Symptoms   After These Interventions, the Patient was reevaluated and was found to be stable.  He does not have comorbidities which  would complicate a diagnosis of syncope.  I suspect that he has a URI, not including COVID  or influenza.  This can be treated symptomatically.  This patient is Presenting for Evaluation of syncope, which does require a range of treatment options, and is a complaint that involves a moderate risk of morbidity and mortality.  The Differential Diagnoses include cardiac disorder, pulmonary disorder, viral infection, metabolic disorder.  I did not require additional Historical Information from anyone, as the patient is a good historian.     Clinical Laboratory Tests Ordered, included CBC, Metabolic panel, and viral panel . Review indicates normal except MCV low, sodium low. Emergent testing abnormality management required for stabilization-no    Cardiac Monitor Tracing which shows normal sinus rhythm, indicating stable cardiac status    Treatment Complication Risk consideration for hospitalization and further evaluation/treatment of syncope    CRITICAL CARE-no Performed by: Daleen Bo  Nursing Notes Reviewed/ Care Coordinated Applicable Imaging Reviewed Interpretation of Laboratory Data incorporated into ED treatment  The patient appears reasonably screened and/or stabilized for discharge and I doubt any other medical condition or other [facility] requiring further screening, evaluation, or treatment in the ED at this time prior to discharge.  Plan: Home Medications-continue usual and use OTC meds as needed; Home Treatments-work release for 2 days, rest, fluids; return here if the recommended treatment, does not improve the symptoms; Recommended follow up-PCP, as needed            Final Clinical Impression(s) / ED Diagnoses Final diagnoses:  Syncope, unspecified syncope type  Viral upper respiratory tract infection    Rx / DC Orders ED Discharge Orders     None         Daleen Bo, MD 04/01/21 2115

## 2021-04-01 NOTE — ED Provider Notes (Signed)
Emergency Medicine Provider Triage Evaluation Note  Eric Arnold , a 26 y.o. male  was evaluated in triage.  Pt complains of single episode that occurred after using the bathroom today.  He states that he remembers using the bathroom and as he was walking out he must of fallen or fainted.  He does not know how long he was out.  He does not know if he hit his head, however he endorses pain to the left side of his face.  He denies vision changes, headache, unilateral weakness.  He has no history of falls prior to this.  No history of syncope.  Review of Systems  Positive: Left-sided facial pain, syncope, fall Negative: Unilateral weakness, vision changes, fevers, recent illness  Physical Exam  BP (!) 135/92 (BP Location: Left Arm)    Pulse 92    Temp 98 F (36.7 C) (Oral)    Resp 16    Ht 5\' 6"  (1.676 m)    Wt 90.7 kg    SpO2 96%    BMI 32.28 kg/m  Gen:   Awake, no distress   Resp:  Normal effort  MSK:   Moves extremities without difficulty  Other:  Patient is calm, face and scalp are atraumatic, no bruising, no abrasions, EOM intact.  Good finger-to-nose.  Eyes PERRL.  Medical Decision Making  Medically screening exam initiated at 12:55 PM.  Appropriate orders placed.  Eric Arnold was informed that the remainder of the evaluation will be completed by another provider, this initial triage assessment does not replace that evaluation, and the importance of remaining in the ED until their evaluation is complete.     [name], [name] 04/01/21 1302    05/30/21, MD 04/01/21 1450
